# Patient Record
Sex: Male | Born: 1980 | Race: Asian | Hispanic: No | Marital: Married | Smoking: Current some day smoker
Health system: Southern US, Community
[De-identification: ages and names within clinical notes are randomized; demographics above are authoritative.]

## PROBLEM LIST (undated history)

## (undated) DIAGNOSIS — N200 Calculus of kidney: Secondary | ICD-10-CM

## (undated) HISTORY — PX: KIDNEY STONE SURGERY: SHX686

---

## 2015-02-25 ENCOUNTER — Encounter (HOSPITAL_COMMUNITY): Payer: Self-pay | Admitting: Emergency Medicine

## 2015-02-25 ENCOUNTER — Emergency Department (HOSPITAL_COMMUNITY)
Admission: EM | Admit: 2015-02-25 | Discharge: 2015-02-25 | Disposition: A | Discharge status: Home / Self Care | Payer: Self-pay | Attending: Emergency Medicine | Admitting: Emergency Medicine

## 2015-02-25 ENCOUNTER — Emergency Department (HOSPITAL_COMMUNITY): Payer: Self-pay

## 2015-02-25 DIAGNOSIS — N201 Calculus of ureter: Secondary | ICD-10-CM | POA: Insufficient documentation

## 2015-02-25 DIAGNOSIS — Z720 Tobacco use: Secondary | ICD-10-CM | POA: Insufficient documentation

## 2015-02-25 HISTORY — DX: Calculus of kidney: N20.0

## 2015-02-25 LAB — I-STAT CHEM 8, ED
BUN: 17 mg/dL (ref 6–20)
CHLORIDE: 104 mmol/L (ref 101–111)
CREATININE: 0.7 mg/dL (ref 0.61–1.24)
Calcium, Ion: 1.15 mmol/L (ref 1.12–1.23)
GLUCOSE: 105 mg/dL — AB (ref 65–99)
HCT: 50 % (ref 39.0–52.0)
HEMOGLOBIN: 17 g/dL (ref 13.0–17.0)
POTASSIUM: 3.5 mmol/L (ref 3.5–5.1)
Sodium: 140 mmol/L (ref 135–145)
TCO2: 22 mmol/L (ref 0–100)

## 2015-02-25 LAB — CBC WITH DIFFERENTIAL/PLATELET
Basophils Absolute: 0 10*3/uL (ref 0.0–0.1)
Basophils Relative: 0 %
EOS PCT: 0 %
Eosinophils Absolute: 0.1 10*3/uL (ref 0.0–0.7)
HCT: 45.8 % (ref 39.0–52.0)
Hemoglobin: 15.4 g/dL (ref 13.0–17.0)
LYMPHS ABS: 1.2 10*3/uL (ref 0.7–4.0)
LYMPHS PCT: 11 %
MCH: 29.7 pg (ref 26.0–34.0)
MCHC: 33.6 g/dL (ref 30.0–36.0)
MCV: 88.2 fL (ref 78.0–100.0)
MONO ABS: 0.5 10*3/uL (ref 0.1–1.0)
MONOS PCT: 4 %
Neutro Abs: 9.4 10*3/uL — ABNORMAL HIGH (ref 1.7–7.7)
Neutrophils Relative %: 85 %
PLATELETS: 246 10*3/uL (ref 150–400)
RBC: 5.19 MIL/uL (ref 4.22–5.81)
RDW: 13.4 % (ref 11.5–15.5)
WBC: 11.2 10*3/uL — ABNORMAL HIGH (ref 4.0–10.5)

## 2015-02-25 LAB — HEPATIC FUNCTION PANEL
ALK PHOS: 70 U/L (ref 38–126)
ALT: 40 U/L (ref 17–63)
AST: 30 U/L (ref 15–41)
Albumin: 4.5 g/dL (ref 3.5–5.0)
BILIRUBIN TOTAL: 0.3 mg/dL (ref 0.3–1.2)
Total Protein: 7.8 g/dL (ref 6.5–8.1)

## 2015-02-25 LAB — BASIC METABOLIC PANEL
Anion gap: 6 (ref 5–15)
BUN: 17 mg/dL (ref 6–20)
CHLORIDE: 106 mmol/L (ref 101–111)
CO2: 24 mmol/L (ref 22–32)
Calcium: 9 mg/dL (ref 8.9–10.3)
Creatinine, Ser: 0.71 mg/dL (ref 0.61–1.24)
GFR calc Af Amer: >60 mL/min (ref >60)
GFR calc non Af Amer: >60 mL/min (ref >60)
GLUCOSE: 104 mg/dL — AB (ref 65–99)
POTASSIUM: 3.4 mmol/L — AB (ref 3.5–5.1)
Sodium: 136 mmol/L (ref 135–145)

## 2015-02-25 LAB — LIPASE, BLOOD: Lipase: 51 U/L (ref 22–51)

## 2015-02-25 MED ORDER — SODIUM CHLORIDE 0.9 % IV SOLN
1000.0000 mL | Freq: Once | INTRAVENOUS | Status: AC
Start: 2015-02-25 — End: 2015-02-25
  Administered 2015-02-25: 1000 mL via INTRAVENOUS

## 2015-02-25 MED ORDER — SODIUM CHLORIDE 0.9 % IV SOLN: 1000.0000 mL | INTRAVENOUS | Status: DC

## 2015-02-25 MED ORDER — ONDANSETRON 4 MG PO TBDP
4.0000 mg | ORAL_TABLET | Freq: Once | ORAL | Status: AC
  Administered 2015-02-25: 4 mg via ORAL
  Filled 2015-02-25: qty 1

## 2015-02-25 MED ORDER — ONDANSETRON HCL 4 MG/2ML IJ SOLN: 4.0000 mg | Freq: Four times a day (QID) | INTRAMUSCULAR | Status: DC | PRN

## 2015-02-25 MED ORDER — ONDANSETRON 8 MG PO TBDP: 8.0000 mg | ORAL_TABLET | Freq: Three times a day (TID) | ORAL | Status: AC | PRN

## 2015-02-25 MED ORDER — OXYCODONE-ACETAMINOPHEN 5-325 MG PO TABS: 1.0000 | ORAL_TABLET | Freq: Four times a day (QID) | ORAL | Status: AC | PRN

## 2015-02-25 MED ORDER — HYDROMORPHONE HCL 1 MG/ML IJ SOLN: 1.0000 mg | Freq: Once | INTRAMUSCULAR | Status: DC | PRN

## 2015-02-25 NOTE — ED Notes (Signed)
Pt is aware of the need for urine sample, states he is unable to give one at this time.

## 2015-02-25 NOTE — ED Notes (Signed)
RN set up [employer] interpreter on phone for Provider

## 2015-02-25 NOTE — Discharge Instructions (Signed)

## 2015-02-25 NOTE — ED Provider Notes (Signed)
CSN: [id number]     Arrival date & time 02/25/15  1115 History   First MD Initiated Contact with Patient 02/25/15 1232     Chief Complaint  Patient presents with  . Flank Pain    l/flank pain  . Nausea    intermittent nausea  . Emesis    vomited x 1 this am     Patient is a 34 y.o. male presenting with flank pain and vomiting. The history is provided by the patient.  Flank Pain This is a new problem. Episode onset: it started this morning severely, he had some mild discomfort a couple of days ago. The problem occurs constantly. Nothing aggravates the symptoms. Nothing relieves the symptoms.  Emesis  patient has a history of kidney stones. The last several days he's had some intermittent pain in his left flank. Morning he had very severe pain in the left flank area. It did not radiate. Patient does have a sense that he has to urinate but has not been actually urinating frequently. The pain has improved significantly from earlier this morning. Right now he feels comfortable.  Past Medical History  Diagnosis Date  . Kidney stones    Past Surgical History  Procedure Laterality Date  . Kidney stone surgery     Family History  Problem Relation Age of Onset  . Hypertension Mother   . Hypertension Father    Social History  Substance Use Topics  . Smoking status: Current Some Day Smoker    Types: Cigarettes  . Smokeless tobacco: None  . Alcohol Use: Yes     Comment: occ    Review of Systems  Gastrointestinal: Positive for nausea and vomiting.  Genitourinary: Positive for flank pain.  All other systems reviewed and are negative.     Allergies  Review of patient's allergies indicates no known allergies.  Home Medications   Prior to Admission medications   Medication Sig Start Date End Date Taking? Authorizing Provider  ondansetron (ZOFRAN ODT) 8 MG disintegrating tablet Take 1 tablet (8 mg total) by mouth every 8 (eight) hours as needed for nausea or vomiting. 02/25/15    Linwood Dibbles, MD  oxyCODONE-acetaminophen (ROXICET) 5-325 MG tablet Take 1-2 tablets by mouth every 6 (six) hours as needed for severe pain. 02/25/15   Linwood Dibbles, MD   BP 129/86 mmHg  Pulse 76  Temp(Src) 97.6 F (36.4 C)  Resp 18  Wt 163 lb (73.936 kg)  SpO2 100% Physical Exam  Constitutional: He appears well-developed and well-nourished. No distress.  HENT:  Head: Normocephalic and atraumatic.  Right Ear: External ear normal.  Left Ear: External ear normal.  Eyes: Conjunctivae are normal. Right eye exhibits no discharge. Left eye exhibits no discharge. No scleral icterus.  Neck: Neck supple. No tracheal deviation present.  Cardiovascular: Normal rate, regular rhythm and intact distal pulses.   Pulmonary/Chest: Effort normal and breath sounds normal. No stridor. No respiratory distress. He has no wheezes. He has no rales.  Abdominal: Soft. Bowel sounds are normal. He exhibits no distension. There is no tenderness. There is CVA tenderness (left side). There is no rebound and no guarding.  Musculoskeletal: He exhibits no edema or tenderness.  Neurological: He is alert. He has normal strength. No cranial nerve deficit (no facial droop, extraocular movements intact, no slurred speech) or sensory deficit. He exhibits normal muscle tone. He displays no seizure activity. Coordination normal.  Skin: Skin is warm and dry. No rash noted.  Psychiatric: He has a normal mood  and affect.  Nursing note and vitals reviewed.   ED Course  Procedures (including critical care time) Labs Review Labs Reviewed  BASIC METABOLIC PANEL - Abnormal; Notable for the following:    Potassium 3.4 (*)    Glucose, Bld 104 (*)    All other components within normal limits  CBC WITH DIFFERENTIAL/PLATELET - Abnormal; Notable for the following:    WBC 11.2 (*)    Neutro Abs 9.4 (*)    All other components within normal limits  HEPATIC FUNCTION PANEL - Abnormal; Notable for the following:    Bilirubin, Direct <0.1 (*)     All other components within normal limits  I-STAT CHEM 8, ED - Abnormal; Notable for the following:    Glucose, Bld 105 (*)    All other components within normal limits  LIPASE, BLOOD  URINALYSIS, ROUTINE W REFLEX MICROSCOPIC (NOT AT [facility])    Imaging Review Ct Abdomen Pelvis Wo Contrast  02/25/2015   CLINICAL DATA:  Left flank pain, nausea, vomiting  EXAM: CT ABDOMEN AND PELVIS WITHOUT CONTRAST  TECHNIQUE: Multidetector CT imaging of the abdomen and pelvis was performed following the standard protocol without IV contrast.  COMPARISON:  None.  FINDINGS: Lower chest: Clear lung bases. Normal heart size. Bilateral retroareolar fibroglandular tissue as can be seen with gynecomastia.  Hepatobiliary: Normal liver. Normal gallbladder. No intrahepatic or extrahepatic biliary ductal dilatation.  Pancreas: Normal.  Spleen: Normal.  Adrenals/Urinary Tract: Normal adrenal glands. 4 mm proximal left ureteral calculus resulting in mild left hydroureteronephrosis. Punctate nonobstructing right renal calculus. No right ureteral calculus. Normal bladder.  Stomach/Bowel: No bowel dilatation or bowel wall thickening. Normal appendix. No pneumoperitoneum, pneumatosis or portal venous gas. No abdominal or pelvic free fluid.  Vascular/Lymphatic: Normal caliber abdominal aorta. No abdominal or pelvic lymphadenopathy.  Other: No fluid collection or hematoma.  Musculoskeletal: No lytic or sclerotic osseous lesion. No acute osseous abnormality.  IMPRESSION: 1. 4 mm proximal left ureteral calculus resulting in mild left hydroureteronephrosis. 2. Punctate nonobstructing right renal calculus. 3. Mild bilateral gynecomastia.   Electronically Signed   By: Elige Ko   On: 02/25/2015 14:01   I have personally reviewed and evaluated these images and lab results as part of my medical decision-making.   MDM   Final diagnoses:  Ureteral stone   Patient's laboratory tests are unremarkable. Patient wasn't able to give a urine  sample in the emergency department. His CT scan does show evidence of a 4 mm proximal left ureteral calculus. This is consistent with the symptoms that he has been experiencing. Patient is pain-free and comfortable in the emergency department.  discharge home with a prescription for pain medications and outpatient follow-up with urology.     Linwood Dibbles, MD 02/25/15 [phone]

## 2015-02-25 NOTE — ED Notes (Signed)
Pt reports intermittent l/flank pain, nausea and vomiting  since 0700 this am. Hx of kidney stones on same side. Friend at bedside-Pt understand some [place], but [place] is primary language

## 2016-08-04 IMAGING — CT CT ABD-PELV W/O CM
2 of 4 series · 16 of 46 positions shown, 18 images · non-contrast
Comparison: None.

CLINICAL DATA: Left flank pain, nausea, vomiting

EXAM:
CT ABDOMEN AND PELVIS WITHOUT CONTRAST
TECHNIQUE: Multidetector CT imaging of the abdomen and pelvis was performed
following the standard protocol without IV contrast.

[Series 2: abd/pel w/o · axial · non-contrast · 0.78mm/px · z∈[-470,-20]mm · 13 of 100 slices shown, 15 images]
[im 5/100  soft-tissue]
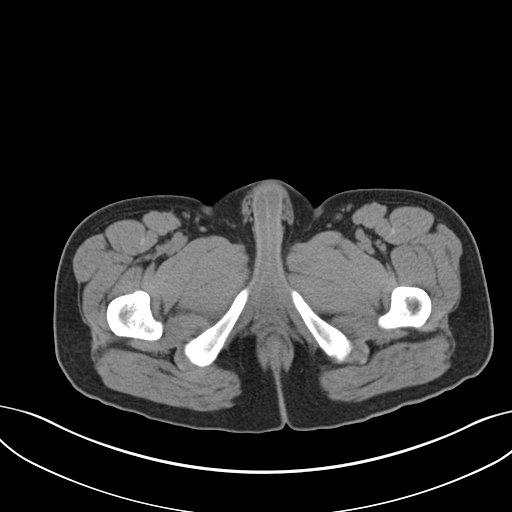
[im 5/100  bone]
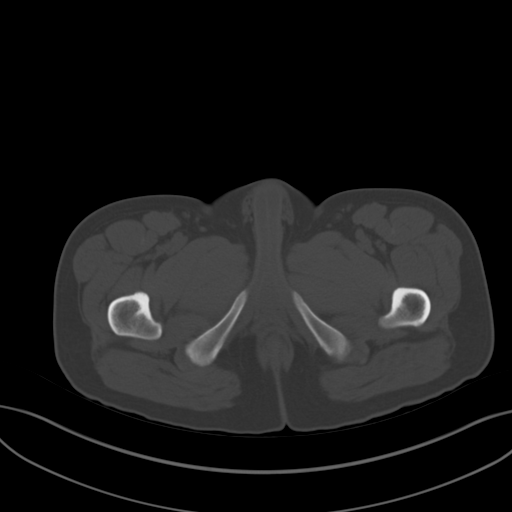
[im 13/100  soft-tissue]
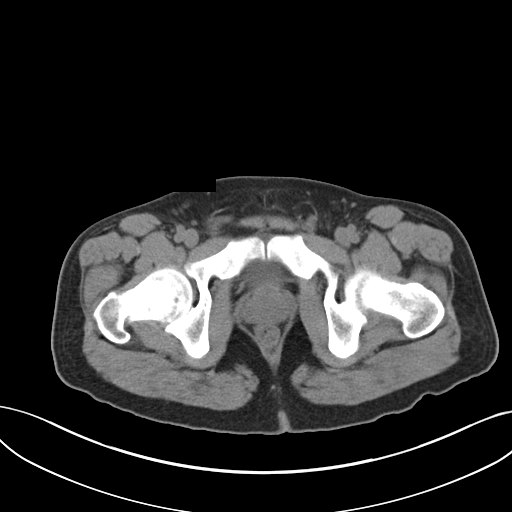
[im 22/100  soft-tissue]
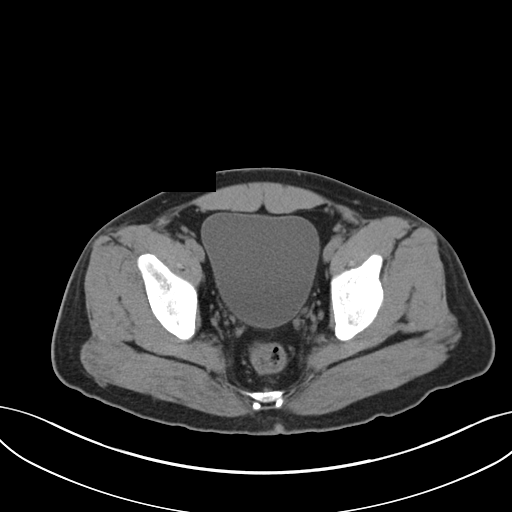
[im 26/100  soft-tissue]
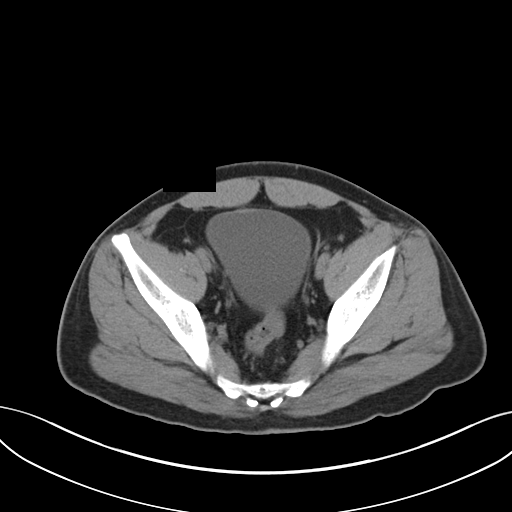
[im 35/100  soft-tissue]
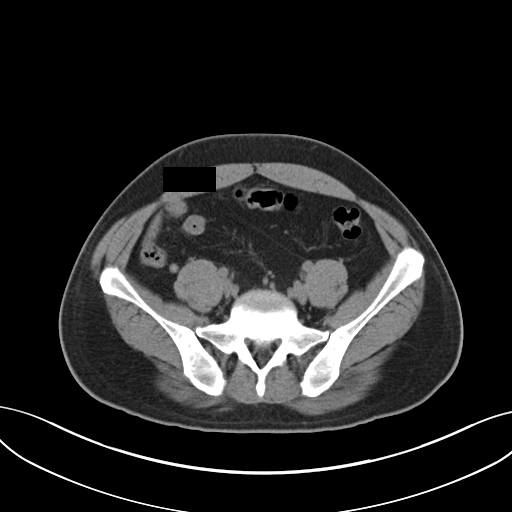
[im 44/100  soft-tissue]
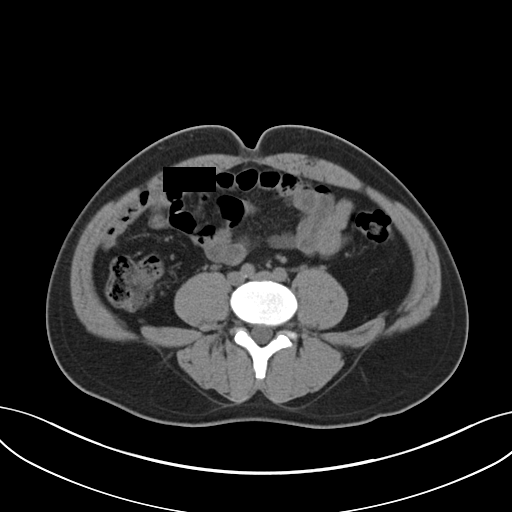
[im 52/100  soft-tissue]
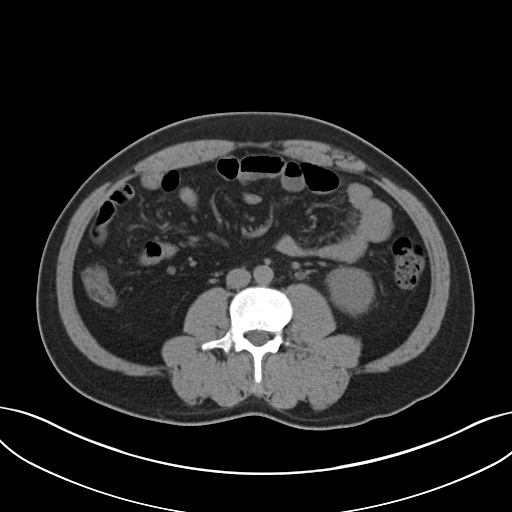
[im 56/100  soft-tissue]
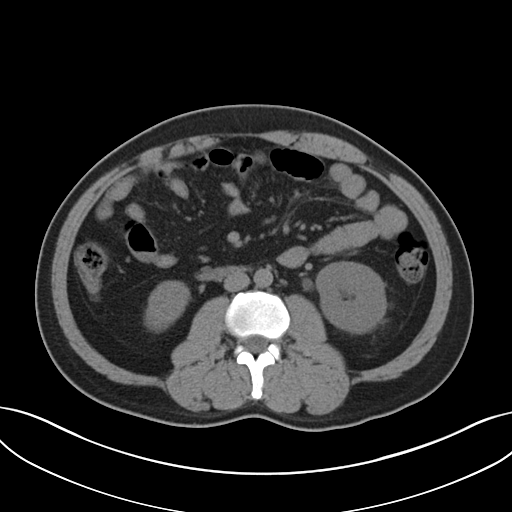
[im 65/100  soft-tissue]
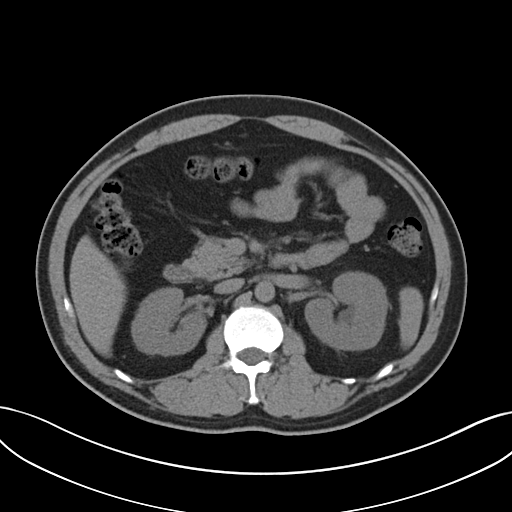
[im 65/100  bone]
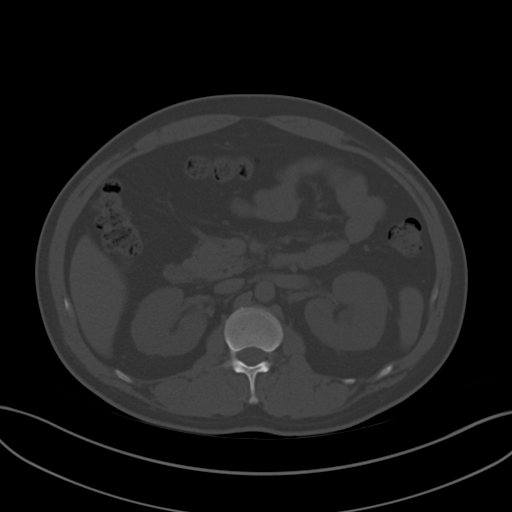
[im 74/100  soft-tissue]
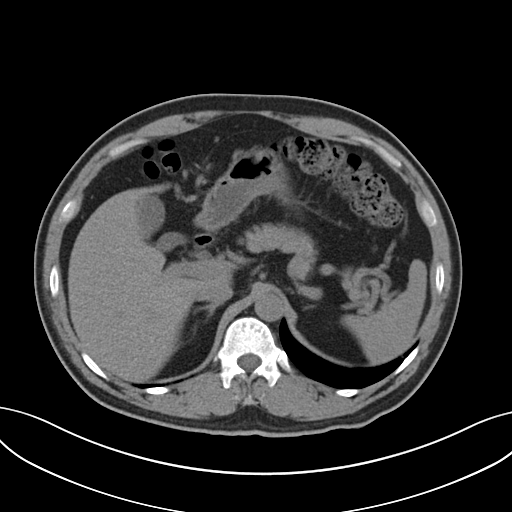
[im 78/100  soft-tissue]
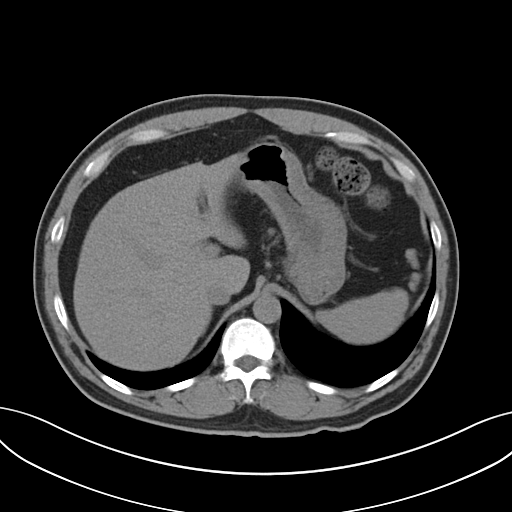
[im 87/100  soft-tissue]
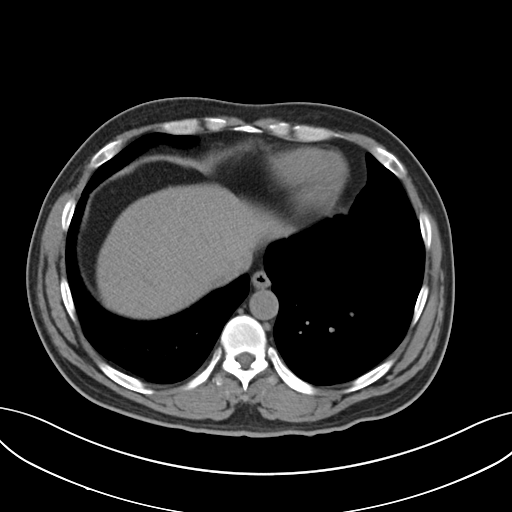
[im 95/100  soft-tissue]
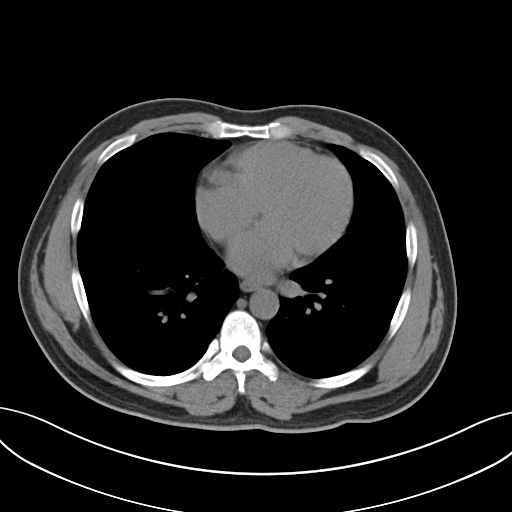

[Series 3: coronal · coronal · 0.74mm/px · 3 of 127 slices shown]
[im 43/127  soft-tissue]
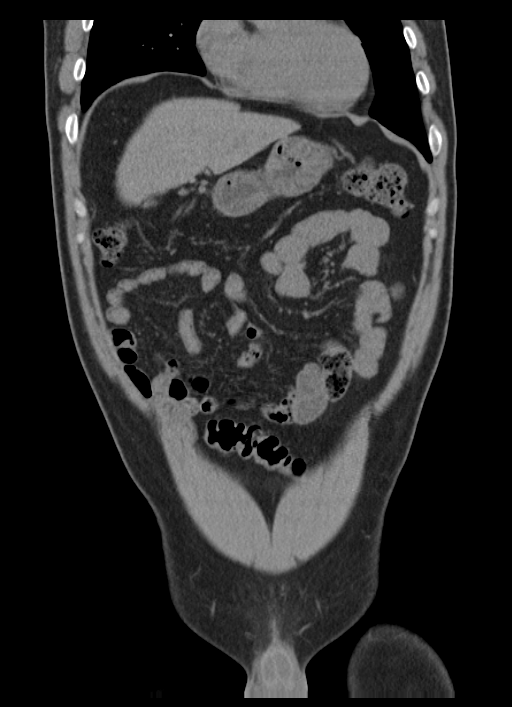
[im 57/127  soft-tissue]
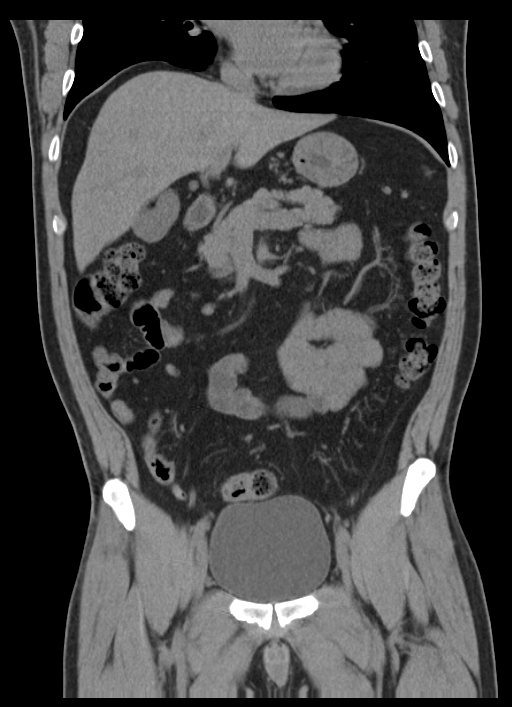
[im 71/127  soft-tissue]
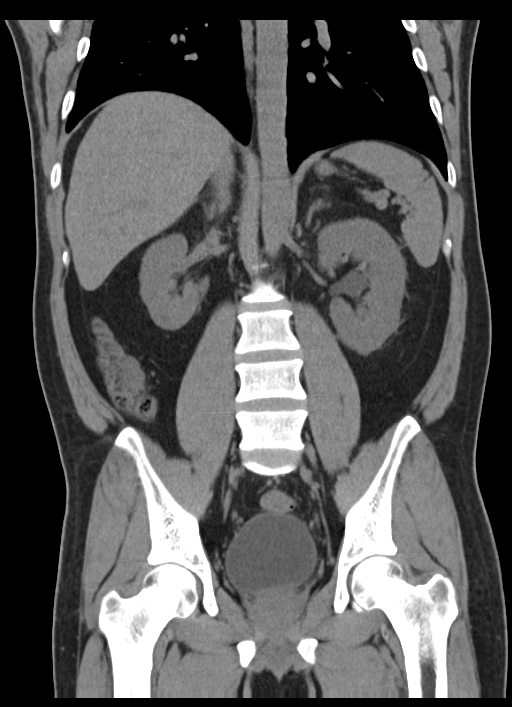

[16 of 46 positions shown; findings below may reference images not displayed]

FINDINGS: Lower chest: Clear lung bases. Normal heart size. Bilateral
retroareolar fibroglandular tissue as can be seen with gynecomastia.

Hepatobiliary: Normal liver. Normal gallbladder. No intrahepatic or
extrahepatic biliary ductal dilatation.

Pancreas: Normal.

Spleen: Normal.

Adrenals/Urinary Tract: Normal adrenal glands. 4 mm proximal left
ureteral calculus resulting in mild left hydroureteronephrosis.
Punctate nonobstructing right renal calculus. No right ureteral
calculus. Normal bladder.

Stomach/Bowel: No bowel dilatation or bowel wall thickening. Normal
appendix. No pneumoperitoneum, pneumatosis or portal venous gas. No
abdominal or pelvic free fluid.

Vascular/Lymphatic: Normal caliber abdominal aorta. No abdominal or
pelvic lymphadenopathy.

Other: No fluid collection or hematoma.

Musculoskeletal: No lytic or sclerotic osseous lesion. No acute
osseous abnormality.
IMPRESSION: 1. 4 mm proximal left ureteral calculus resulting in mild left
hydroureteronephrosis.
2. Punctate nonobstructing right renal calculus.
3. Mild bilateral gynecomastia.
